# Patient Record
Sex: Male | Born: 1970 | Smoking: Never smoker
Health system: Southern US, Community
[De-identification: ages and names within clinical notes are randomized; demographics above are authoritative.]

---

## 2017-11-18 ENCOUNTER — Emergency Department (HOSPITAL_COMMUNITY): Payer: No Typology Code available for payment source

## 2017-11-18 ENCOUNTER — Encounter (HOSPITAL_COMMUNITY): Payer: Self-pay | Admitting: *Deleted

## 2017-11-18 ENCOUNTER — Emergency Department (HOSPITAL_COMMUNITY)
Admission: EM | Admit: 2017-11-18 | Discharge: 2017-11-18 | Disposition: A | Discharge status: Home / Self Care | Payer: No Typology Code available for payment source | Attending: Emergency Medicine | Admitting: Emergency Medicine

## 2017-11-18 ENCOUNTER — Other Ambulatory Visit: Payer: Self-pay

## 2017-11-18 DIAGNOSIS — Y929 Unspecified place or not applicable: Secondary | ICD-10-CM | POA: Insufficient documentation

## 2017-11-18 DIAGNOSIS — S060X0A Concussion without loss of consciousness, initial encounter: Secondary | ICD-10-CM | POA: Diagnosis not present

## 2017-11-18 DIAGNOSIS — Y939 Activity, unspecified: Secondary | ICD-10-CM | POA: Diagnosis not present

## 2017-11-18 DIAGNOSIS — S161XXA Strain of muscle, fascia and tendon at neck level, initial encounter: Secondary | ICD-10-CM | POA: Insufficient documentation

## 2017-11-18 DIAGNOSIS — S50311A Abrasion of right elbow, initial encounter: Secondary | ICD-10-CM | POA: Diagnosis not present

## 2017-11-18 DIAGNOSIS — Y999 Unspecified external cause status: Secondary | ICD-10-CM | POA: Insufficient documentation

## 2017-11-18 MED ORDER — ACETAMINOPHEN 500 MG PO TABS
1000.0000 mg | ORAL_TABLET | Freq: Once | ORAL | Status: AC
Start: 2017-11-18 — End: 2017-11-18
  Administered 2017-11-18: 1000 mg via ORAL
  Filled 2017-11-18: qty 2
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
## 2017-11-18 NOTE — Discharge Instructions (Addendum)
If you develop any worsening pain or new/concerning symptoms then return to the ER for evaluation.  This includes headache, vomiting, dizziness, blurry vision.  Otherwise he may take ibuprofen and Tylenol for pain.
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
